# Patient Record
Sex: Male | Born: 1980 | Race: White | Hispanic: No | Smoking: Never smoker
Health system: Southern US, Community
[De-identification: ages and names within clinical notes are randomized; demographics above are authoritative.]

---
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
## 1999-11-30 ENCOUNTER — Observation Stay (HOSPITAL_COMMUNITY): Admission: EM | Admit: 1999-11-30 | Discharge: 1999-12-01 | Payer: Self-pay | Admitting: Emergency Medicine

## 1999-11-30 ENCOUNTER — Encounter: Payer: Self-pay | Admitting: *Deleted

## 2002-11-03 ENCOUNTER — Emergency Department (HOSPITAL_COMMUNITY): Admission: EM | Admit: 2002-11-03 | Discharge: 2002-11-03 | Payer: Self-pay | Admitting: Emergency Medicine

## 2002-11-03 ENCOUNTER — Encounter: Payer: Self-pay | Admitting: Emergency Medicine

## 2004-06-07 ENCOUNTER — Emergency Department (HOSPITAL_COMMUNITY): Admission: EM | Admit: 2004-06-07 | Discharge: 2004-06-07 | Payer: Self-pay | Admitting: Emergency Medicine

## 2004-09-07 ENCOUNTER — Emergency Department (HOSPITAL_COMMUNITY): Admission: EM | Admit: 2004-09-07 | Discharge: 2004-09-07 | Payer: Self-pay | Admitting: Emergency Medicine

## 2005-03-26 ENCOUNTER — Emergency Department (HOSPITAL_COMMUNITY): Admission: EM | Admit: 2005-03-26 | Discharge: 2005-03-26 | Payer: Self-pay | Admitting: Emergency Medicine

## 2007-11-25 ENCOUNTER — Emergency Department (HOSPITAL_COMMUNITY): Admission: EM | Admit: 2007-11-25 | Discharge: 2007-11-26 | Payer: Self-pay | Admitting: Emergency Medicine

## 2009-03-19 ENCOUNTER — Encounter: Admission: RE | Admit: 2009-03-19 | Discharge: 2009-03-19 | Payer: Self-pay | Admitting: Otolaryngology

## 2009-04-01 ENCOUNTER — Other Ambulatory Visit: Admission: RE | Admit: 2009-04-01 | Discharge: 2009-04-01 | Payer: Self-pay | Admitting: Otolaryngology

## 2011-04-04 NOTE — H&P (Signed)
Paul Reynolds, Paul Reynolds              ACCOUNT NO.:  [id number]   MEDICAL RECORD NO.:  [id number]          PATIENT TYPE:  EMS   LOCATION:  [facility]                         FACILITY:  [facility]   PHYSICIAN:  Girard Cooter, MD         DATE OF BIRTH:  May 06, 1981   DATE OF ADMISSION:  11/25/2007  DATE OF DISCHARGE:                              HISTORY & PHYSICAL   PRIMARY CARE PHYSICIAN:  Unassigned.   HISTORY OF PRESENT ILLNESS:  The patient is a 30 year old male who  presents with a rash which has been going on for the last month or so  but has been getting progressively worse.  The patient first saw a walk-  in center in [facility], then saw [facility] on [employer] [employer].  The patient was first put on Bactrim initially by the walk-in  clinic, and then Bactrim was discontinued as the rash got worse.  The  rash is located on his knees behind his knees and also in his hair and  head and his wrists bilaterally.  The rash is blistering and has areas  of vesicles.  Apparently, the patient then got referred to a  dermatologist, [facility].  He does not remember the  dermatologist's name, and he had treatment with oral systemic steroids  which apparently resolved the rash.  Then upon discontinuation of the  steroids, the rash persisted.  Currently, he complaints of pain.  The  rash is nonpruritic.  He denies history of fevers, nausea, vomiting,  abdominal pain.  He also denies changes in his mental status, neck pain.  He denies recent travel.  He denies exposure to ticks.  He also denies  any new over-the-counter or herbal medications.  He denies illicit drug  use or intravenous drug abuse.  Otherwise, he has no other significant  medical problems.   PAST MEDICAL HISTORY:  He only has a history of asthma.  He denies a  history of eczema or psoriasis as a child.   SOCIAL HISTORY:  Denies drugs, alcohol or tobacco.  Otherwise, review of  systems is negative except for in the skin  positive rash and blisters,  negative for pruritus.  He is sexually active with one partner.  Denies  promiscuous sexual contact.  He also denies any urethritis.   PHYSICAL EXAMINATION:  VITAL SIGNS:  Blood pressure 149/93, pulse rate  is 116, respirations 20, temperature 97.5.  HEENT:  The patient is well-developed, well-nourished, well-hydrated.  Normocephalic, atraumatic.  Sclerae anicteric.  PERRLA.  Extraocular  movements intact.  NECK:  Supple.  No JVD.  No carotid bruits.  LUNGS:  Clear to auscultation bilaterally.  No rhonchi, rales or  wheezes.  CARDIOVASCULAR:  S1, S2.  Regular rhythm, tachy.  No murmurs, rubs or  clicks.  ABDOMEN:  Soft, nontender, nondistended.  Positive bowel sounds.  No  hepatosplenomegaly.  EXTREMITIES:  No cyanosis, clubbing or edema.  NEUROLOGICAL:  Alert and oriented x3.  Cranial nerves II-XII grossly  intact.  SKIN:  There is an erythematous rash with serpiginous borders on the  bilateral knees and  popliteal fossa along with the wrists and the feet.  There is bullae bilaterally in the popliteal fossa.   LABORATORY DATA:  White count 14.2, hemoglobin 16.4, hematocrit 47.1,  platelets 226, creatinine 1.2.  Sodium 136, potassium 3.9, chloride 106,  glucose 104, BUN 13.   ASSESSMENT/PLAN:  Blistering type dermatitis with leukocytosis and  tachycardia.  Will go ahead and admit the patient, rule out any  worrisome causes of dermatitis including exfoliative dermatitis versus  erythema and multiforme.  As noted, the patient has already been  evaluated by a dermatologist, and per the patient, he says that it is an  allergic-type of dermatitis.  Regardless, I think what we will do is go  ahead and admit the patient and empirically start Solu-Medrol IV 80 mg  q.6 h. I will put him on a sliding scale insulin sensitive scale for  blood sugars.  Additionally, I will get a Lyme titer and [facility]  Spotted Fever titer.  We may consider getting a  dermatology consult for  a biopsy.  Also, I will get ESR, ANA, blood cultures, rapid  streptococcal swab, and I will go ahead and start the patient on empiric  treatment with doxycycline.  I have explained the plans of procedure  this admission to patient.  The patient understands.      Girard Cooter, MD  Electronically Signed     RR/MEDQ  D:  11/26/2007  T:  11/26/2007  Job:  [id number]

## 2011-04-07 NOTE — Discharge Summary (Signed)
[facility]  Patient:    Paul Reynolds                      MRN: [id number] Adm. Date:  [id number] Disc. Date: 12/01/99 Attending:  Heber Reynolds [place] CC:         Paul Reynolds, M.D. [facility]                           Discharge Summary  HISTORY OF PRESENT ILLNESS:  Paul Reynolds is a 30 year old white male patient of r. Paul Reynolds, admitted through the emergency room on November 30, 1999 due to seizures.  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  He then began having grand mal seizures lasting 2-3 minutes.  The patient was post ______ on arrival to the emergency room and on arrival by EMS.  He and his father denied any prior history of seizures while in the emergency room.  Drug screen was positive for cocaine nd marijuana.  PAST MEDICAL HISTORY:  Significant for asthma.  He denies any hospitalizations.  ALLERGIES:  None.  CURRENT MEDICATIONS:  Albuterol metered-dose inhaler 3 x monthly.  SOCIAL HISTORY:  Occasional marijuana and cocaine.  He reports that he does not use crack cocaine, but he snorts cocaine and last did this three days ago.  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  FAMILY HISTORY:  Noncontributory.  REVIEW OF SYSTEMS:  Other symptoms were negative.  PHYSICAL EXAMINATION:  VITAL SIGNS:  Temperature  97.6, blood pressure 112/63, and O2 saturations were 96% on room air.  Afebrile.  GENERAL:  He was a young, white male in no acute distress.  Exam was completely  nonfocal and normal.  HOSPITAL COURSE:  SEIZURES:  The patient was admitted for observation.  There were no further seizures.  The patient was not placed on medication for seizures. He did have a head CT, which was negative in the emergency room.  FINAL DIAGNOSES: 1. 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 2. Cocaine and marijuana use.  DISCHARGE MEDICATIONS:   None.  DISCHARGE ACTIVITIES:  Activity as tolerated.  DISCHARGE DIET:  Regular.  SPECIAL INSTRUCTIONS:  I advised the patient in the presence of his mother and father to obtain counseling for his drug problems.  FOLLOWUP:  Follow up with Dr. Posey Reynolds as needed. DD:  12/01/99 TD:  12/01/99 Job: [id number]

## 2011-08-10 ENCOUNTER — Other Ambulatory Visit: Payer: Self-pay | Admitting: Sports Medicine

## 2011-08-10 ENCOUNTER — Ambulatory Visit
Admission: RE | Admit: 2011-08-10 | Discharge: 2011-08-10 | Disposition: A | Discharge status: Home / Self Care | Payer: 59 | Source: Ambulatory Visit | Attending: Sports Medicine | Admitting: Sports Medicine

## 2011-08-10 DIAGNOSIS — T148XXA Other injury of unspecified body region, initial encounter: Secondary | ICD-10-CM

## 2011-08-10 LAB — DIFFERENTIAL
Basophils Absolute: 0.1
Basophils Relative: 0
Lymphocytes Relative: 19
Monocytes Relative: 5
Neutro Abs: 10.5 — ABNORMAL HIGH
Neutrophils Relative %: 74

## 2011-08-10 LAB — CBC
MCHC: 34.7
RBC: 5.47
RDW: 12.9

## 2011-08-10 LAB — I-STAT 8, (EC8 V) (CONVERTED LAB)
Acid-base deficit: 2
Chloride: 106
Hemoglobin: 16.7
Potassium: 3.9
Sodium: 136
TCO2: 23

## 2011-08-10 LAB — CULTURE, BLOOD (ROUTINE X 2): Culture: NO GROWTH

## 2011-08-10 LAB — ANA: Anti Nuclear Antibody(ANA): NEGATIVE

## 2011-08-21 ENCOUNTER — Other Ambulatory Visit: Payer: Self-pay | Admitting: Diagnostic Neuroimaging

## 2011-08-21 DIAGNOSIS — R42 Dizziness and giddiness: Secondary | ICD-10-CM

## 2011-08-21 DIAGNOSIS — H5702 Anisocoria: Secondary | ICD-10-CM

## 2011-08-21 DIAGNOSIS — F0781 Postconcussional syndrome: Secondary | ICD-10-CM

## 2011-08-25 ENCOUNTER — Other Ambulatory Visit: Payer: 59

## 2011-08-25 ENCOUNTER — Ambulatory Visit
Admission: RE | Admit: 2011-08-25 | Discharge: 2011-08-25 | Disposition: A | Discharge status: Home / Self Care | Payer: 59 | Source: Ambulatory Visit | Attending: Diagnostic Neuroimaging | Admitting: Diagnostic Neuroimaging

## 2011-08-25 DIAGNOSIS — H5702 Anisocoria: Secondary | ICD-10-CM

## 2011-08-25 DIAGNOSIS — R42 Dizziness and giddiness: Secondary | ICD-10-CM

## 2011-08-25 DIAGNOSIS — F0781 Postconcussional syndrome: Secondary | ICD-10-CM

## 2012-03-02 ENCOUNTER — Emergency Department (HOSPITAL_COMMUNITY): Payer: 59

## 2012-03-02 ENCOUNTER — Emergency Department (HOSPITAL_COMMUNITY)
Admission: EM | Admit: 2012-03-02 | Discharge: 2012-03-03 | Disposition: A | Discharge status: Home / Self Care | Payer: 59 | Attending: Emergency Medicine | Admitting: Emergency Medicine

## 2012-03-02 ENCOUNTER — Encounter (HOSPITAL_COMMUNITY): Payer: Self-pay | Admitting: Emergency Medicine

## 2012-03-02 DIAGNOSIS — IMO0002 Reserved for concepts with insufficient information to code with codable children: Secondary | ICD-10-CM | POA: Insufficient documentation

## 2012-03-02 DIAGNOSIS — M25569 Pain in unspecified knee: Secondary | ICD-10-CM | POA: Insufficient documentation

## 2012-03-02 DIAGNOSIS — S8391XA Sprain of unspecified site of right knee, initial encounter: Secondary | ICD-10-CM

## 2012-03-02 DIAGNOSIS — X500XXA Overexertion from strenuous movement or load, initial encounter: Secondary | ICD-10-CM | POA: Insufficient documentation

## 2012-03-02 MED ORDER — IBUPROFEN 800 MG PO TABS: 800.0000 mg | ORAL_TABLET | Freq: Three times a day (TID) | ORAL | Status: AC

## 2012-03-02 MED ORDER — HYDROCODONE-ACETAMINOPHEN 5-325 MG PO TABS
1.0000 | ORAL_TABLET | Freq: Once | ORAL | Status: AC
  Administered 2012-03-02: 1 via ORAL
  Filled 2012-03-02: qty 1

## 2012-03-02 MED ORDER — HYDROCODONE-ACETAMINOPHEN 5-325 MG PO TABS: 1.0000 | ORAL_TABLET | ORAL | Status: AC | PRN

## 2012-03-02 NOTE — Discharge Instructions (Signed)
Take vicodin as prescribed for severe pain.   Do not drive within four hours of taking this medication (may cause drowsiness or confusion).  Take ibuprofen w/ food up to three times a day, as needed for pain.  Ice 2-3 times a day for 15-20 minutes.  Elevate when possible.  Follow up with the orthopedic physician if the pain has not started to improve in 5-7 days.   You may return to the ER if symptoms worsen or you have any other concerns.

## 2012-03-02 NOTE — ED Provider Notes (Signed)
History     CSN: [id number]  Arrival date & time 03/02/12  2139   First MD Initiated Contact with Patient 03/02/12 2222      Chief Complaint  Patient presents with  . Knee Injury    R knee, playing sports    (Consider location/radiation/quality/duration/timing/severity/associated sxs/prior treatment) HPI History provided by pt.   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  Has had intermittent right knee pain ever since that improved w/ icing and ibuprofen.  This evening his knee popped again and gave way while running in a softball game, causing him to fall.  Did not sustain any injuries in the fall but knee pain is more severe.  Aggravated by bearing weight.    Past Medical History  Diagnosis Date  . Asthma     History reviewed. No pertinent past surgical history.  History reviewed. No pertinent family history.  History  Substance Use Topics  . Smoking status: Never Smoker   . Smokeless tobacco: Not on file  . Alcohol Use: Yes     occasionally      Review of Systems  All other systems reviewed and are negative.    Allergies  Bactrim  Home Medications  No current outpatient prescriptions on file.  BP 134/80  Pulse 75  Temp(Src) 98.9 F (37.2 C) (Oral)  Resp 20  SpO2 99%  Physical Exam  Nursing note and vitals reviewed. Constitutional: He is oriented to person, place, and time. He appears well-developed and well-nourished. No distress.  HENT:  Head: Normocephalic and atraumatic.  Eyes:       Normal appearance  Neck: Normal range of motion.  Musculoskeletal:       Right knee w/out deformity or edema.  Mild tenderness medial and lateral to patella only.  Full active ROM but pain w/ both extreme flexion and extension.  No knee pain w/ ROM of ankle.  No laxity or pain w/ valgus/varus pressure.  2+ DP pulse and distal sensation  intact.    Neurological: He is alert and oriented to person, place, and time.  Psychiatric: He has a normal mood and affect. His behavior is normal.    ED Course  Procedures (including critical care time)  Labs Reviewed - No data to display Dg Knee Complete 4 Views Right  03/02/2012  *RADIOLOGY REPORT*  Clinical Data: Knee instability and pain.  RIGHT KNEE - COMPLETE 4+ VIEW  Comparison:  None.  Findings:  There is no evidence of fracture, dislocation, or joint effusion.  There is no evidence of arthropathy or other focal bone abnormality.  Soft tissues are unremarkable.  IMPRESSION: Negative.  Original Report Authenticated By: Danae Orleans, M.D.     1. Sprain of right knee       MDM  Pt presents w/ right knee popping/pain/laxity.  Mild tenderness anterior knee; full ROM, no objective laxity and NV intact on exam.  Xray neg for fx/dislocation.  Results discussed w/ pt.  Pt received one vicodin for pain in ED.  Ortho tech placed in knee immobilizer to prevent him from having another fall.  D/c'd home w/ 12 vicodin, 800mg  ibuprofen and referral to ortho for persistent sx.          Otilio Miu, [place] 03/03/12 0110

## 2012-03-02 NOTE — ED Notes (Signed)
Pt states he felt a "pop" in his right knee Wednesday while working with pain.  Pain had subsided by today. This evening was running and felt the same "pop" and more pain in the right knee.  Pt ambulatory to exam room.

## 2012-03-03 NOTE — ED Provider Notes (Signed)
Medical screening examination/treatment/procedure(s) were performed by non-physician practitioner and as supervising physician I was immediately available for consultation/collaboration.  Jasmine Awe, MD 03/03/12 [phone]
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
## 2016-02-19 ENCOUNTER — Emergency Department (HOSPITAL_COMMUNITY)
Admission: EM | Admit: 2016-02-19 | Discharge: 2016-02-19 | Disposition: A | Discharge status: Home / Self Care | Payer: 59 | Attending: Emergency Medicine | Admitting: Emergency Medicine

## 2016-02-19 ENCOUNTER — Encounter (HOSPITAL_COMMUNITY): Payer: Self-pay | Admitting: *Deleted

## 2016-02-19 DIAGNOSIS — Z79899 Other long term (current) drug therapy: Secondary | ICD-10-CM | POA: Insufficient documentation

## 2016-02-19 DIAGNOSIS — S3992XA Unspecified injury of lower back, initial encounter: Secondary | ICD-10-CM | POA: Diagnosis present

## 2016-02-19 DIAGNOSIS — J45909 Unspecified asthma, uncomplicated: Secondary | ICD-10-CM | POA: Insufficient documentation

## 2016-02-19 DIAGNOSIS — Z7951 Long term (current) use of inhaled steroids: Secondary | ICD-10-CM | POA: Insufficient documentation

## 2016-02-19 DIAGNOSIS — Y9389 Activity, other specified: Secondary | ICD-10-CM | POA: Insufficient documentation

## 2016-02-19 DIAGNOSIS — Y998 Other external cause status: Secondary | ICD-10-CM | POA: Diagnosis not present

## 2016-02-19 DIAGNOSIS — M545 Low back pain, unspecified: Secondary | ICD-10-CM

## 2016-02-19 DIAGNOSIS — Y92410 Unspecified street and highway as the place of occurrence of the external cause: Secondary | ICD-10-CM | POA: Diagnosis not present

## 2016-02-19 MED ORDER — CYCLOBENZAPRINE HCL 10 MG PO TABS: 10.0000 mg | ORAL_TABLET | Freq: Two times a day (BID) | ORAL | Status: AC | PRN

## 2016-02-19 MED ORDER — NAPROXEN 500 MG PO TABS: 500.0000 mg | ORAL_TABLET | Freq: Two times a day (BID) | ORAL | Status: AC

## 2016-02-19 NOTE — ED Notes (Signed)
Declined W/C at D/C and was escorted to lobby by RN. 
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
## 2017-04-06 DIAGNOSIS — J45909 Unspecified asthma, uncomplicated: Secondary | ICD-10-CM | POA: Diagnosis not present

## 2017-10-16 DIAGNOSIS — J018 Other acute sinusitis: Secondary | ICD-10-CM | POA: Diagnosis not present

## 2017-10-16 DIAGNOSIS — R05 Cough: Secondary | ICD-10-CM | POA: Diagnosis not present

## 2017-12-19 DIAGNOSIS — J4520 Mild intermittent asthma, uncomplicated: Secondary | ICD-10-CM | POA: Diagnosis not present

## 2017-12-19 DIAGNOSIS — K219 Gastro-esophageal reflux disease without esophagitis: Secondary | ICD-10-CM | POA: Diagnosis not present

## 2017-12-19 DIAGNOSIS — J309 Allergic rhinitis, unspecified: Secondary | ICD-10-CM | POA: Diagnosis not present

## 2017-12-19 DIAGNOSIS — Z23 Encounter for immunization: Secondary | ICD-10-CM | POA: Diagnosis not present

## 2018-02-06 DIAGNOSIS — Z136 Encounter for screening for cardiovascular disorders: Secondary | ICD-10-CM | POA: Diagnosis not present

## 2018-02-06 DIAGNOSIS — Z0000 Encounter for general adult medical examination without abnormal findings: Secondary | ICD-10-CM | POA: Diagnosis not present

## 2018-02-06 DIAGNOSIS — R030 Elevated blood-pressure reading, without diagnosis of hypertension: Secondary | ICD-10-CM | POA: Diagnosis not present

## 2018-02-06 DIAGNOSIS — J4520 Mild intermittent asthma, uncomplicated: Secondary | ICD-10-CM | POA: Diagnosis not present

## 2018-02-06 DIAGNOSIS — J309 Allergic rhinitis, unspecified: Secondary | ICD-10-CM | POA: Diagnosis not present

## 2020-10-26 ENCOUNTER — Ambulatory Visit: Payer: Self-pay | Attending: Internal Medicine

## 2020-10-26 DIAGNOSIS — Z23 Encounter for immunization: Secondary | ICD-10-CM

## 2020-10-26 NOTE — Progress Notes (Signed)
   [facility]  Name:  Paul Reynolds    MRN: [id number] DOB: 25-Jun-1981  10/26/2020  [name] was observed post Covid-19 immunization for 15 minutes without incident. He was provided with Vaccine Information Sheet and instruction to access the V-Safe system.   [name] was instructed to call 911 with any severe reactions post vaccine: Marland Kitchen Difficulty breathing  . Swelling of face and throat  . A fast heartbeat  . A bad rash all over body  . Dizziness and weakness   Immunizations Administered    No immunizations on file.
